# Patient Record
Sex: Female | Born: 1950 | Race: Black or African American | Hispanic: No | Marital: Married | Smoking: Never smoker
Health system: Southern US, Community
[De-identification: ages and names within clinical notes are randomized; demographics above are authoritative.]

## PROBLEM LIST (undated history)

## (undated) DIAGNOSIS — M329 Systemic lupus erythematosus, unspecified: Secondary | ICD-10-CM

## (undated) DIAGNOSIS — IMO0002 Reserved for concepts with insufficient information to code with codable children: Secondary | ICD-10-CM

## (undated) DIAGNOSIS — I10 Essential (primary) hypertension: Secondary | ICD-10-CM

---

## 2018-02-14 ENCOUNTER — Emergency Department (HOSPITAL_BASED_OUTPATIENT_CLINIC_OR_DEPARTMENT_OTHER): Payer: No Typology Code available for payment source

## 2018-02-14 ENCOUNTER — Encounter (HOSPITAL_BASED_OUTPATIENT_CLINIC_OR_DEPARTMENT_OTHER): Payer: Self-pay | Admitting: *Deleted

## 2018-02-14 ENCOUNTER — Emergency Department (HOSPITAL_BASED_OUTPATIENT_CLINIC_OR_DEPARTMENT_OTHER)
Admission: EM | Admit: 2018-02-14 | Discharge: 2018-02-14 | Disposition: A | Discharge status: Home / Self Care | Payer: No Typology Code available for payment source | Attending: Emergency Medicine | Admitting: Emergency Medicine

## 2018-02-14 ENCOUNTER — Other Ambulatory Visit: Payer: Self-pay

## 2018-02-14 DIAGNOSIS — Y92410 Unspecified street and highway as the place of occurrence of the external cause: Secondary | ICD-10-CM | POA: Diagnosis not present

## 2018-02-14 DIAGNOSIS — I10 Essential (primary) hypertension: Secondary | ICD-10-CM | POA: Diagnosis not present

## 2018-02-14 DIAGNOSIS — Y9389 Activity, other specified: Secondary | ICD-10-CM | POA: Diagnosis not present

## 2018-02-14 DIAGNOSIS — M545 Low back pain, unspecified: Secondary | ICD-10-CM

## 2018-02-14 DIAGNOSIS — S6981XA Other specified injuries of right wrist, hand and finger(s), initial encounter: Secondary | ICD-10-CM | POA: Diagnosis not present

## 2018-02-14 DIAGNOSIS — Y999 Unspecified external cause status: Secondary | ICD-10-CM | POA: Insufficient documentation

## 2018-02-14 DIAGNOSIS — G8911 Acute pain due to trauma: Secondary | ICD-10-CM | POA: Diagnosis not present

## 2018-02-14 DIAGNOSIS — S3992XA Unspecified injury of lower back, initial encounter: Secondary | ICD-10-CM | POA: Diagnosis present

## 2018-02-14 DIAGNOSIS — S62001A Unspecified fracture of navicular [scaphoid] bone of right wrist, initial encounter for closed fracture: Secondary | ICD-10-CM

## 2018-02-14 HISTORY — DX: Systemic lupus erythematosus, unspecified: M32.9

## 2018-02-14 HISTORY — DX: Reserved for concepts with insufficient information to code with codable children: IMO0002

## 2018-02-14 HISTORY — DX: Essential (primary) hypertension: I10

## 2018-02-14 MED ORDER — METHOCARBAMOL 500 MG PO TABS: 500.0000 mg | ORAL_TABLET | Freq: Three times a day (TID) | ORAL | 0 refills | Status: AC | PRN
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
